# Patient Record
Sex: Female | Born: 1960 | Race: White | Hispanic: No | Marital: Married | Smoking: Never smoker
Health system: Southern US, Community
[De-identification: ages and names within clinical notes are randomized; demographics above are authoritative.]

## PROBLEM LIST (undated history)

## (undated) DIAGNOSIS — E039 Hypothyroidism, unspecified: Secondary | ICD-10-CM

## (undated) HISTORY — PX: EYE SURGERY: SHX253

## (undated) HISTORY — PX: ABDOMINAL HYSTERECTOMY: SHX81

---

## 2010-12-08 ENCOUNTER — Other Ambulatory Visit: Payer: Self-pay | Admitting: Obstetrics & Gynecology

## 2010-12-08 DIAGNOSIS — Z139 Encounter for screening, unspecified: Secondary | ICD-10-CM

## 2010-12-13 ENCOUNTER — Ambulatory Visit (HOSPITAL_COMMUNITY)
Admission: RE | Admit: 2010-12-13 | Discharge: 2010-12-13 | Disposition: A | Discharge status: Home / Self Care | Payer: 59 | Source: Ambulatory Visit | Attending: Obstetrics & Gynecology | Admitting: Obstetrics & Gynecology

## 2010-12-13 DIAGNOSIS — Z139 Encounter for screening, unspecified: Secondary | ICD-10-CM

## 2010-12-13 DIAGNOSIS — Z1231 Encounter for screening mammogram for malignant neoplasm of breast: Secondary | ICD-10-CM | POA: Insufficient documentation

## 2018-03-22 ENCOUNTER — Other Ambulatory Visit (HOSPITAL_COMMUNITY): Payer: Self-pay | Admitting: Family Medicine

## 2018-03-22 DIAGNOSIS — Z1231 Encounter for screening mammogram for malignant neoplasm of breast: Secondary | ICD-10-CM

## 2018-04-08 ENCOUNTER — Encounter (HOSPITAL_COMMUNITY): Payer: Self-pay

## 2018-04-08 ENCOUNTER — Ambulatory Visit (HOSPITAL_COMMUNITY)
Admission: RE | Admit: 2018-04-08 | Discharge: 2018-04-08 | Disposition: A | Discharge status: Home / Self Care | Payer: BLUE CROSS/BLUE SHIELD | Source: Ambulatory Visit | Attending: Family Medicine | Admitting: Family Medicine

## 2018-04-08 ENCOUNTER — Other Ambulatory Visit (HOSPITAL_COMMUNITY): Payer: Self-pay | Admitting: Family Medicine

## 2018-04-08 DIAGNOSIS — Z1231 Encounter for screening mammogram for malignant neoplasm of breast: Secondary | ICD-10-CM | POA: Diagnosis present

## 2018-04-11 ENCOUNTER — Other Ambulatory Visit (HOSPITAL_COMMUNITY): Payer: Self-pay | Admitting: Family Medicine

## 2018-04-11 DIAGNOSIS — R921 Mammographic calcification found on diagnostic imaging of breast: Secondary | ICD-10-CM

## 2018-05-14 ENCOUNTER — Ambulatory Visit (HOSPITAL_COMMUNITY)
Admission: RE | Admit: 2018-05-14 | Discharge: 2018-05-14 | Disposition: A | Discharge status: Home / Self Care | Payer: BLUE CROSS/BLUE SHIELD | Source: Ambulatory Visit | Attending: Family Medicine | Admitting: Family Medicine

## 2018-05-14 DIAGNOSIS — R921 Mammographic calcification found on diagnostic imaging of breast: Secondary | ICD-10-CM | POA: Insufficient documentation

## 2018-05-22 ENCOUNTER — Other Ambulatory Visit: Payer: Self-pay | Admitting: Family Medicine

## 2018-05-22 DIAGNOSIS — R921 Mammographic calcification found on diagnostic imaging of breast: Secondary | ICD-10-CM

## 2018-06-21 ENCOUNTER — Ambulatory Visit
Admission: RE | Admit: 2018-06-21 | Discharge: 2018-06-21 | Disposition: A | Discharge status: Home / Self Care | Payer: BLUE CROSS/BLUE SHIELD | Source: Ambulatory Visit | Attending: Family Medicine | Admitting: Family Medicine

## 2018-06-21 DIAGNOSIS — R921 Mammographic calcification found on diagnostic imaging of breast: Secondary | ICD-10-CM

## 2018-09-09 ENCOUNTER — Ambulatory Visit (INDEPENDENT_AMBULATORY_CARE_PROVIDER_SITE_OTHER): Payer: Self-pay

## 2018-09-09 DIAGNOSIS — Z1211 Encounter for screening for malignant neoplasm of colon: Secondary | ICD-10-CM

## 2018-09-09 MED ORDER — NA SULFATE-K SULFATE-MG SULF 17.5-3.13-1.6 GM/177ML PO SOLN: 1.0000 | ORAL | 0 refills | Status: AC

## 2018-09-09 NOTE — Progress Notes (Signed)
Gastroenterology Pre-Procedure Review  Request Date:09/09/18 Requesting Physician: Dr.DonDiego no previous tcs  PATIENT REVIEW QUESTIONS: The patient responded to the following health history questions as indicated:    1. Diabetes Melitis: no 2. Joint replacements in the past 12 months: no 3. Major health problems in the past 3 months: no 4. Has an artificial valve or MVP: no 5. Has a defibrillator: no 6. Has been advised in past to take antibiotics in advance of a procedure like teeth cleaning: no 7. Family history of colon cancer: no  8. Alcohol Use: no 9. History of sleep apnea: no  10. History of coronary artery or other vascular stents placed within the last 12 months: no 11. History of any prior anesthesia complications: no    MEDICATIONS & ALLERGIES:    Patient reports the following regarding taking any blood thinners:   Plavix? no Aspirin? no Coumadin? no Brilinta? no Xarelto? no Eliquis? no Pradaxa? no Savaysa? no Effient? no  Patient confirms/reports the following medications:  Current Outpatient Medications  Medication Sig Dispense Refill  . Cholecalciferol (VITAMIN D-1000 MAX ST) 25 MCG (1000 UT) tablet Take by mouth daily.    Marland Kitchen levothyroxine (SYNTHROID, LEVOTHROID) 100 MCG tablet Take 100 mcg by mouth daily.  5  . pravastatin (PRAVACHOL) 40 MG tablet Take 40 mg by mouth daily.  3   No current facility-administered medications for this visit.     Patient confirms/reports the following allergies:  No Known Allergies  No orders of the defined types were placed in this encounter.   AUTHORIZATION INFORMATION Primary Insurance: [place],  [place] #: [id number] Pre-Cert / Josem Kaufmann required:  Pre-Cert / Josem Kaufmann #:    SCHEDULE INFORMATION: Procedure has been scheduled as follows:  Date: 11/06/18, Time: 1:00 Location: [facility] Dr.Rourk  This Gastroenterology Pre-Precedure Review Form is being routed to the following provider(s):

## 2018-09-09 NOTE — Patient Instructions (Signed)
Barbara Cruz  Apr 22, 1961 MRN: [id number]     Procedure Date: 11/06/18 Time to register: 12:00pm Place to register: [facility] Procedure Time: 1:00pm Scheduled provider: R. Garfield Cornea, MD    PREPARATION FOR COLONOSCOPY WITH SUPREP BOWEL PREP KIT  Note: Suprep Bowel Prep Kit is a split-dose (2day) regimen. Consumption of BOTH 6-ounce bottles is required for a complete prep.  Please notify us immediately if you are diabetic, take iron supplements, or if you are on Coumadin or any other blood thinners.                                                                                                                                                  2 DAYS BEFORE PROCEDURE:  DATE:  11/04/18  DAY: Monday Begin clear liquid diet AFTER your lunch meal. NO SOLID FOODS after this point.  1 DAY BEFORE PROCEDURE:  DATE: 11/05/18   DAY: Tuesday Continue clear liquids the entire day - NO SOLID FOOD.   At 6:00pm: Complete steps 1 through 4 below, using ONE (1) 6-ounce bottle, before going to bed. Step 1:  Pour ONE (1) 6-ounce bottle of SUPREP liquid into the mixing container.  Step 2:  Add cool drinking water to the 16 ounce line on the container and mix.  Note: Dilute the solution concentrate as directed prior to use. Step 3:  DRINK ALL the liquid in the container. Step 4:  You MUST drink an additional two (2) or more 16 ounce containers of water over the next one (1) hour.   Continue clear liquids.  DAY OF PROCEDURE:   DATE: 11/06/18  DAY: Wednesday If you take medications for your heart, blood pressure, or breathing, you may take these medications.   5 hours before your procedure at :8:00am Step 1:  Pour ONE (1) 6-ounce bottle of SUPREP liquid into the mixing container.  Step 2:  Add cool drinking water to the 16 ounce line on the container and mix.  Note: Dilute the solution concentrate as directed prior to use. Step 3:  DRINK ALL the liquid in the container. Step 4:  You MUST  drink an additional two (2) or more 16 ounce containers of water over the next one (1) hour. You MUST complete the final glass of water at least 3 hours before your colonoscopy.   Nothing by mouth past 10:00am  You may take your morning medications with sip of water unless we have instructed otherwise.    Please see below for Dietary Information.  CLEAR LIQUIDS INCLUDE:  Water Jello (NOT red in color)   Ice Popsicles (NOT red in color)   Tea (sugar ok, no milk/cream) Powdered fruit flavored drinks  Coffee (sugar ok, no milk/cream) Gatorade/ Lemonade/ Kool-Aid  (NOT red in color)   Juice: apple, white grape, white cranberry Soft drinks  Clear bullion,  consomme, broth (fat free beef/chicken/vegetable)  Carbonated beverages (any kind)  Strained chicken noodle soup Hard Candy   Remember: Clear liquids are liquids that will allow you to see your fingers on the other side of a clear glass. Be sure liquids are NOT red in color, and not cloudy, but CLEAR.  DO NOT EAT OR DRINK ANY OF THE FOLLOWING:  Dairy products of any kind   Cranberry juice Tomato juice / V8 juice   Grapefruit juice Orange juice     Red grape juice  Do not eat any solid foods, including such foods as: cereal, oatmeal, yogurt, fruits, vegetables, creamed soups, eggs, bread, crackers, pureed foods in a blender, etc.   HELPFUL HINTS FOR DRINKING PREP SOLUTION:   Make sure prep is extremely cold. Mix and refrigerate the the morning of the prep. You may also put in the freezer.   You may try mixing some Crystal Light or Country Time Lemonade if you prefer. Mix in small amounts; add more if necessary.  Try drinking through a straw  Rinse mouth with water or a mouthwash between glasses, to remove after-taste.  Try sipping on a cold beverage /ice/ popsicles between glasses of prep.  Place a piece of sugar-free hard candy in mouth between glasses.  If you become nauseated, try consuming smaller amounts, or stretch out the  time between glasses. Stop for 30-60 minutes, then slowly start back drinking.     OTHER INSTRUCTIONS  You will need a responsible adult at least 59 years of age to accompany you and drive you home. This person must remain in the waiting room during your procedure. The hospital will cancel your procedure if you do not have a responsible adult with you.   1. Wear loose fitting clothing that is easily removed. 2. Leave jewelry and other valuables at home.  3. Remove all body piercing jewelry and leave at home. 4. Total time from sign-in until discharge is approximately 2-3 hours. 5. You should go home directly after your procedure and rest. You can resume normal activities the day after your procedure. 6. The day of your procedure you should not:  Drive  Make legal decisions  Operate machinery  Drink alcohol  Return to work   You may call the office (Dept: [phone]) before 5:00pm, or page the doctor on call [phone]) after 5:00pm, for further instructions, if necessary.   Insurance Information YOU WILL NEED TO CHECK WITH YOUR INSURANCE COMPANY FOR THE BENEFITS OF COVERAGE YOU HAVE FOR THIS PROCEDURE.  UNFORTUNATELY, NOT ALL INSURANCE COMPANIES HAVE BENEFITS TO COVER ALL OR PART OF THESE TYPES OF PROCEDURES.  IT IS YOUR RESPONSIBILITY TO CHECK YOUR BENEFITS, HOWEVER, WE WILL BE GLAD TO ASSIST YOU WITH ANY CODES YOUR INSURANCE COMPANY MAY NEED.    PLEASE NOTE THAT MOST INSURANCE COMPANIES WILL NOT COVER A SCREENING COLONOSCOPY FOR PEOPLE UNDER THE AGE OF 50  IF YOU HAVE BCBS INSURANCE, YOU MAY HAVE BENEFITS FOR A SCREENING COLONOSCOPY BUT IF POLYPS ARE FOUND THE DIAGNOSIS WILL CHANGE AND THEN YOU MAY HAVE A DEDUCTIBLE THAT WILL NEED TO BE MET. SO PLEASE MAKE SURE YOU CHECK YOUR BENEFITS FOR A SCREENING COLONOSCOPY AS WELL AS A DIAGNOSTIC COLONOSCOPY.

## 2018-09-27 NOTE — Progress Notes (Signed)
Ok to schedule.

## 2018-10-04 ENCOUNTER — Other Ambulatory Visit: Payer: Self-pay | Admitting: Family Medicine

## 2018-10-04 DIAGNOSIS — Z1231 Encounter for screening mammogram for malignant neoplasm of breast: Secondary | ICD-10-CM

## 2018-11-05 ENCOUNTER — Telehealth: Payer: Self-pay

## 2018-11-05 NOTE — Telephone Encounter (Signed)
Called pt, TCS for tomorrow moved up to 12:00pm. She will arrive at 11:00am. Advised her start drinking 2nd half of prep tomorrow at 7:00am. NPO after 9:00am. Endo scheduler informed.

## 2018-11-06 ENCOUNTER — Encounter (HOSPITAL_COMMUNITY): Payer: Self-pay | Admitting: *Deleted

## 2018-11-06 ENCOUNTER — Ambulatory Visit (HOSPITAL_COMMUNITY)
Admission: RE | Admit: 2018-11-06 | Discharge: 2018-11-06 | Disposition: A | Discharge status: Home / Self Care | Payer: BLUE CROSS/BLUE SHIELD | Attending: Internal Medicine | Admitting: Internal Medicine

## 2018-11-06 ENCOUNTER — Encounter (HOSPITAL_COMMUNITY)
Admission: RE | Disposition: A | Discharge status: Home / Self Care | Payer: Self-pay | Source: Home / Self Care | Attending: Internal Medicine

## 2018-11-06 ENCOUNTER — Other Ambulatory Visit: Payer: Self-pay

## 2018-11-06 DIAGNOSIS — Z79899 Other long term (current) drug therapy: Secondary | ICD-10-CM | POA: Insufficient documentation

## 2018-11-06 DIAGNOSIS — E039 Hypothyroidism, unspecified: Secondary | ICD-10-CM | POA: Insufficient documentation

## 2018-11-06 DIAGNOSIS — Z1211 Encounter for screening for malignant neoplasm of colon: Secondary | ICD-10-CM | POA: Insufficient documentation

## 2018-11-06 DIAGNOSIS — Z833 Family history of diabetes mellitus: Secondary | ICD-10-CM | POA: Insufficient documentation

## 2018-11-06 DIAGNOSIS — D125 Benign neoplasm of sigmoid colon: Secondary | ICD-10-CM | POA: Insufficient documentation

## 2018-11-06 DIAGNOSIS — Z8249 Family history of ischemic heart disease and other diseases of the circulatory system: Secondary | ICD-10-CM | POA: Diagnosis not present

## 2018-11-06 DIAGNOSIS — Z90710 Acquired absence of both cervix and uterus: Secondary | ICD-10-CM | POA: Diagnosis not present

## 2018-11-06 HISTORY — PX: POLYPECTOMY: SHX5525

## 2018-11-06 HISTORY — PX: COLONOSCOPY: SHX5424

## 2018-11-06 HISTORY — DX: Hypothyroidism, unspecified: E03.9

## 2018-11-06 SURGERY — COLONOSCOPY
Anesthesia: Moderate Sedation

## 2018-11-06 MED ORDER — ONDANSETRON HCL 4 MG/2ML IJ SOLN
INTRAMUSCULAR | Status: DC | PRN
  Administered 2018-11-06: 4 mg via INTRAVENOUS

## 2018-11-06 MED ORDER — STERILE WATER FOR IRRIGATION IR SOLN
Status: DC | PRN
  Administered 2018-11-06: 12:00:00

## 2018-11-06 MED ORDER — MEPERIDINE HCL 100 MG/ML IJ SOLN
INTRAMUSCULAR | Status: DC | PRN
  Administered 2018-11-06: 25 mg via INTRAVENOUS
  Administered 2018-11-06: 15 mg via INTRAVENOUS

## 2018-11-06 MED ORDER — MIDAZOLAM HCL 5 MG/5ML IJ SOLN
INTRAMUSCULAR | Status: DC | PRN
  Administered 2018-11-06 (×2): 1 mg via INTRAVENOUS
  Administered 2018-11-06 (×2): 2 mg via INTRAVENOUS
  Administered 2018-11-06: 1 mg via INTRAVENOUS

## 2018-11-06 MED ORDER — ONDANSETRON HCL 4 MG/2ML IJ SOLN
INTRAMUSCULAR | Status: AC
  Filled 2018-11-06: qty 2

## 2018-11-06 MED ORDER — MIDAZOLAM HCL 5 MG/5ML IJ SOLN
INTRAMUSCULAR | Status: AC
  Filled 2018-11-06: qty 10

## 2018-11-06 MED ORDER — MEPERIDINE HCL 100 MG/ML IJ SOLN
INTRAMUSCULAR | Status: AC
  Filled 2018-11-06: qty 1

## 2018-11-06 MED ORDER — SODIUM CHLORIDE 0.9 % IV SOLN
INTRAVENOUS | Status: DC
  Administered 2018-11-06: 1000 mL via INTRAVENOUS

## 2018-11-06 NOTE — Op Note (Signed)
[facility] Patient Name: Barbara Cruz Procedure Date: 11/06/2018 11:25 AM MRN: [id number] Date of Birth: [birth year] Attending MD: Norvel Richards , MD CSN: [id number] Age: 58 Admit Type: Outpatient Procedure:                Colonoscopy Indications:              Screening for colorectal malignant neoplasm Providers:                Norvel Richards, MD, Otis Peak B. Sharon Seller, RN,                            Nelma Rothman, Technician Referring MD:             Ralene Bathe. Dondiego, MD Medicines:                Midazolam 7 mg IV, Meperidine 40 mg IV Complications:            No immediate complications. Estimated Blood Loss:     Estimated blood loss was minimal. Procedure:                Pre-Anesthesia Assessment:                           - Prior to the procedure, a History and Physical                            was performed, and patient medications and                            allergies were reviewed. The patient's tolerance of                            previous anesthesia was also reviewed. The risks                            and benefits of the procedure and the sedation                            options and risks were discussed with the patient.                            All questions were answered, and informed consent                            was obtained. Prior Anticoagulants: The patient has                            taken no previous anticoagulant or antiplatelet                            agents. ASA Grade Assessment: II - A patient with                            mild systemic disease. After reviewing the risks  and benefits, the patient was deemed in                            satisfactory condition to undergo the procedure.                           After obtaining informed consent, the colonoscope                            was passed under direct vision. Throughout the                            procedure, the patient's blood  pressure, pulse, and                            oxygen saturations were monitored continuously. The                            CF-[id number] ([id number]) scope was introduced through                            the anus and advanced to the the cecum, identified                            by appendiceal orifice and ileocecal valve. The                            colonoscopy was performed without difficulty. The                            patient tolerated the procedure well. The quality                            of the bowel preparation was adequate. Scope In: 11:38:26 AM Scope Out: 11:55:58 AM Scope Withdrawal Time: 0 hours 13 minutes 10 seconds  Total Procedure Duration: 0 hours 17 minutes 32 seconds  Findings:      The perianal and digital rectal examinations were normal.      A 8 mm polyp was found in the sigmoid colon. The polyp was       semi-pedunculated. The polyp was removed with a cold snare. Resection       and retrieval were complete. Estimated blood loss was minimal.      The exam was otherwise without abnormality on direct and retroflexion       views. Impression:               - One 8 mm polyp in the sigmoid colon, removed with                            a cold snare. Resected and retrieved.                           - The examination was otherwise normal on direct  and retroflexion views. Moderate Sedation:      Moderate (conscious) sedation was administered by the endoscopy nurse       and supervised by the endoscopist. The following parameters were       monitored: oxygen saturation, heart rate, blood pressure, respiratory       rate, EKG, adequacy of pulmonary ventilation, and response to care.       Total physician intraservice time was 24 minutes. Recommendation:           - Patient has a contact number available for                            emergencies. The signs and symptoms of potential                            delayed complications were  discussed with the                            patient. Return to normal activities tomorrow.                            Written discharge instructions were provided to the                            patient.                           - Resume previous diet.                           - Repeat colonoscopy date to be determined after                            pending pathology results are reviewed for                            surveillance based on pathology results.                           - Return to GI office (date not yet determined). Procedure Code(s):        --- Professional ---                           [phone], Colonoscopy, flexible; with removal of                            tumor(s), polyp(s), or other lesion(s) by snare                            technique                           99153, Moderate sedation; each additional 15                            minutes intraservice time  G0500, Moderate sedation services provided by the                            same physician or other qualified health care                            professional performing a gastrointestinal                            endoscopic service that sedation supports,                            requiring the presence of an independent trained                            observer to assist in the monitoring of the                            patient's level of consciousness and physiological                            status; initial 15 minutes of intra-service time;                            patient age 23 years or older (additional time may                            be reported with [phone], as appropriate) Diagnosis Code(s):        --- Professional ---                           Z12.11, Encounter for screening for malignant                            neoplasm of colon                           D12.5, Benign neoplasm of sigmoid colon CPT copyright 2018 American Medical Association. All  rights reserved. The codes documented in this report are preliminary and upon coder review may  be revised to meet current compliance requirements. Cristopher Estimable. Aaryn Sermon, MD Norvel Richards, MD 11/06/2018 12:00:01 PM This report has been signed electronically. Number of Addenda: 0

## 2018-11-06 NOTE — H&P (Signed)
_0 @   Primary Care Physician:  Lucia Gaskins, MD Primary Gastroenterologist:  Dr. Gala Romney  Pre-Procedure History & Physical: HPI:  Barbara Cruz is a 58 y.o. female is here for a screening colonoscopy.  No bowel symptoms.  No family history of colon cancer.  No prior colonoscopy.  Past Medical History:  Diagnosis Date  . Hypothyroidism     Past Surgical History:  Procedure Laterality Date  . ABDOMINAL HYSTERECTOMY     partial  . EYE SURGERY Bilateral    lasik    Prior to Admission medications   Medication Sig Start Date End Date Taking? Authorizing Provider  Cholecalciferol (VITAMIN D3) 125 MCG (5000 UT) CAPS Take 5,000 Units by mouth daily.   Yes [provider]  levothyroxine (SYNTHROID, LEVOTHROID) 100 MCG tablet Take 100 mcg by mouth daily before breakfast.  06/05/18  Yes [provider]  Na Sulfate-K Sulfate-Mg Sulf (SUPREP BOWEL PREP KIT) 17.5-3.13-1.6 GM/177ML SOLN Take 1 kit by mouth as directed. 09/09/18  Yes Annitta Needs, NP  pravastatin (PRAVACHOL) 40 MG tablet Take 40 mg by mouth daily. 06/05/18  Yes [provider]    Allergies as of 09/09/2018  . (No Known Allergies)    Family History  Problem Relation Age of Onset  . Diabetes Mother   . Hypertension Mother   . Diabetes Father     Social History   Socioeconomic History  . Marital status: Married    Spouse name: Not on file  . Number of children: Not on file  . Years of education: Not on file  . Highest education level: Not on file  Occupational History  . Not on file  Social Needs  . Financial resource strain: Not on file  . Food insecurity:    Worry: Not on file    Inability: Not on file  . Transportation needs:    Medical: Not on file    Non-medical: Not on file  Tobacco Use  . Smoking status: Never Smoker  . Smokeless tobacco: Never Used  Substance and Sexual Activity  . Alcohol use: Yes    Comment: socally  . Drug use: Never  . Sexual activity: Not on file   Lifestyle  . Physical activity:    Days per week: Not on file    Minutes per session: Not on file  . Stress: Not on file  Relationships  . Social connections:    Talks on phone: Not on file    Gets together: Not on file    Attends religious service: Not on file    Active member of club or organization: Not on file    Attends meetings of clubs or organizations: Not on file    Relationship status: Not on file  . Intimate partner violence:    Fear of current or ex partner: Not on file    Emotionally abused: Not on file    Physically abused: Not on file    Forced sexual activity: Not on file  Other Topics Concern  . Not on file  Social History Narrative  . Not on file    Review of Systems: See HPI, otherwise negative ROS  Physical Exam: BP 127/69   Pulse 67   Temp 97.7 F (36.5 C) (Oral)   Resp 10   Ht _1  (1.778 m)   Wt 72.6 kg   SpO2 100%   BMI 22.96 kg/m  General:   Alert,  Well-developed, well-nourished, pleasant and cooperative in NAD Lungs:  Clear throughout  to auscultation.   No wheezes, crackles, or rhonchi. No acute distress. Heart:  Regular rate and rhythm; no murmurs, clicks, rubs,  or gallops. Abdomen:  Soft, nontender and nondistended. No masses, hepatosplenomegaly or hernias noted. Normal bowel sounds, without guarding, and without rebound.    Impression/Plan: Barbara Cruz is now here to undergo a screening colonoscopy.  First ever average risk screening examination.  Risks, benefits, limitations, imponderables and alternatives regarding colonoscopy have been reviewed with the patient. Questions have been answered. All parties agreeable.     Notice:  This dictation was prepared with Dragon dictation along with smaller phrase technology. Any transcriptional errors that result from this process are unintentional and may not be corrected upon review.

## 2018-11-06 NOTE — OR Nursing (Signed)
Barbara Cruz had a procedure done at [facility] today on 11/06/2018 and cannot return to work until 12:00 noon on 11/07/2018.  Ericka Pontiff, RN 11/06/18 12:41 PM

## 2018-11-06 NOTE — Discharge Instructions (Signed)
°Colonoscopy °Discharge Instructions ° °Read the instructions outlined below and refer to this sheet in the next few weeks. These discharge instructions provide you with general information on caring for yourself after you leave the hospital. Your doctor may also give you specific instructions. While your treatment has been planned according to the most current medical practices available, unavoidable complications occasionally occur. If you have any problems or questions after discharge, call Dr. Rourk at [phone]. °ACTIVITY °· You may resume your regular activity, but move at a slower pace for the next 24 hours.  °· Take frequent rest periods for the next 24 hours.  °· Walking will help get rid of the air and reduce the bloated feeling in your belly (abdomen).  °· No driving for 24 hours (because of the medicine (anesthesia) used during the test).   °· Do not sign any important legal documents or operate any machinery for 24 hours (because of the anesthesia used during the test).  °NUTRITION °· Drink plenty of fluids.  °· You may resume your normal diet as instructed by your doctor.  °· Begin with a light meal and progress to your normal diet. Heavy or fried foods are harder to digest and may make you feel sick to your stomach (nauseated).  °· Avoid alcoholic beverages for 24 hours or as instructed.  °MEDICATIONS °· You may resume your normal medications unless your doctor tells you otherwise.  °WHAT YOU CAN EXPECT TODAY °· Some feelings of bloating in the abdomen.  °· Passage of more gas than usual.  °· Spotting of blood in your stool or on the toilet paper.  °IF YOU HAD POLYPS REMOVED DURING THE COLONOSCOPY: °· No aspirin products for 7 days or as instructed.  °· No alcohol for 7 days or as instructed.  °· Eat a soft diet for the next 24 hours.  °FINDING OUT THE RESULTS OF YOUR TEST °Not all test results are available during your visit. If your test results are not back during the visit, make an appointment  with your caregiver to find out the results. Do not assume everything is normal if you have not heard from your caregiver or the medical facility. It is important for you to follow up on all of your test results.  °SEEK IMMEDIATE MEDICAL ATTENTION IF: °· You have more than a spotting of blood in your stool.  °· Your belly is swollen (abdominal distention).  °· You are nauseated or vomiting.  °· You have a temperature over 101.  °· You have abdominal pain or discomfort that is severe or gets worse throughout the day.  ° ° °Colon polyp information provided ° °Further recommendations to follow pending review of pathology report ° ° °Colon Polyps ° °Polyps are tissue growths inside the body. Polyps can grow in many places, including the large intestine (colon). A polyp may be a round bump or a mushroom-shaped growth. You could have one polyp or several. °Most colon polyps are noncancerous (benign). However, some colon polyps can become cancerous over time. Finding and removing the polyps early can help prevent this. °What are the causes? °The exact cause of colon polyps is not known. °What increases the risk? °You are more likely to develop this condition if you: °· Have a family history of colon cancer or colon polyps. °· Are older than 50 or older than 45 if you are African American. °· Have inflammatory bowel disease, such as ulcerative colitis or Crohn's disease. °· Have certain hereditary conditions, such   as: °? Familial adenomatous polyposis. °? Lynch syndrome. °? Turcot syndrome. °? Peutz-Jeghers syndrome. °· Are overweight. °· Smoke cigarettes. °· Do not get enough exercise. °· Drink too much alcohol. °· Eat a diet that is high in fat and red meat and low in fiber. °· Had childhood cancer that was treated with abdominal radiation. °What are the signs or symptoms? °Most polyps do not cause symptoms. °If you have symptoms, they may include: °· Blood coming from your rectum when having a bowel movement. °· Blood in  your stool. The stool may look dark red or black. °· Abdominal pain. °· A change in bowel habits, such as constipation or diarrhea. °How is this diagnosed? °This condition is diagnosed with a colonoscopy. This is a procedure in which a lighted, flexible scope is inserted into the anus and then passed into the colon to examine the area. Polyps are sometimes found when a colonoscopy is done as part of routine cancer screening tests. °How is this treated? °Treatment for this condition involves removing any polyps that are found. Most polyps can be removed during a colonoscopy. Those polyps will then be tested for cancer. Additional treatment may be needed depending on the results of testing. °Follow these instructions at home: °Lifestyle °· Maintain a healthy weight, or lose weight if recommended by your health care provider. °· Exercise every day or as told by your health care provider. °· Do not use any products that contain nicotine or tobacco, such as cigarettes and e-cigarettes. If you need help quitting, ask your health care provider. °· If you drink alcohol, limit how much you have: °? 0-1 drink a day for women. °? 0-2 drinks a day for men. °· Be aware of how much alcohol is in your drink. In the [place] one drink equals one 12 oz bottle of beer (355 mL), one 5 oz glass of wine (148 mL), or one 1½ oz shot of hard liquor (44 mL). °Eating and drinking ° °· Eat foods that are high in fiber, such as fruits, vegetables, and whole grains. °· Eat foods that are high in calcium and vitamin D, such as milk, cheese, yogurt, eggs, liver, fish, and broccoli. °· Limit foods that are high in fat, such as fried foods and desserts. °· Limit the amount of red meat and processed meat you eat, such as hot dogs, sausage, bacon, and lunch meats. °General instructions °· Keep all follow-up visits as told by your health care provider. This is important. °? This includes having regularly scheduled colonoscopies. °? Talk to your health  care provider about when you need a colonoscopy. °Contact a health care provider if: °· You have new or worsening bleeding during a bowel movement. °· You have new or increased blood in your stool. °· You have a change in bowel habits. °· You lose weight for no known reason. °Summary °· Polyps are tissue growths inside the body. Polyps can grow in many places, including the colon. °· Most colon polyps are noncancerous (benign), but some can become cancerous over time. °· This condition is diagnosed with a colonoscopy. °· Treatment for this condition involves removing any polyps that are found. Most polyps can be removed during a colonoscopy. °This information is not intended to replace advice given to you by your health care provider. Make sure you discuss any questions you have with your health care provider. °Document Released: 05/31/2004 Document Revised: 12/20/2017 Document Reviewed: 12/20/2017 °[employer] Interactive Patient Education © 2019 [employer] ° °

## 2018-11-08 ENCOUNTER — Encounter (HOSPITAL_COMMUNITY): Payer: Self-pay | Admitting: Internal Medicine

## 2018-11-09 ENCOUNTER — Encounter: Payer: Self-pay | Admitting: Internal Medicine

## 2019-04-11 ENCOUNTER — Ambulatory Visit: Payer: BLUE CROSS/BLUE SHIELD

## 2019-07-08 IMAGING — MG STEREOTACTIC VACUUM ASSIST RIGHT
7 series · 7 of 7 positions shown · non-contrast
Comparison: Previous exams.

ADDENDUM:
Pathology revealed FIBROADENOMA, MICROCALCIFICATIONS of the Left
breast, upper outer quadrant. This was found to be concordant by Dr.
Niezz Laminah.

Pathology revealed FIBROADENOMA, MICROCALCIFICATIONS of the Right
breast, upper inner quadrant. This was found to be concordant by Dr.
Pathology results were discussed with the patient by telephone. The
patient reported doing well after the biopsies with tenderness at
the sites. Post biopsy instructions and care were reviewed and
questions were answered. The patient was encouraged to call The
The patient was instructed to return for annual screening
mammography at [HOSPITAL] in [HOSPITAL][HOSPITAL].
Pathology results reported by Barnellini Waclawek, RN on 06/24/2018.
CLINICAL DATA: Patient presents for stereotactic core needle biopsy
of bilateral, amorphous calcifications.
EXAM:
LEFT BREAST STEREOTACTIC CORE NEEDLE BIOPSY
RIGHT BREAST STEREOTACTIC CORE NEEDLE BIOPSY

[R (1 of 7)]
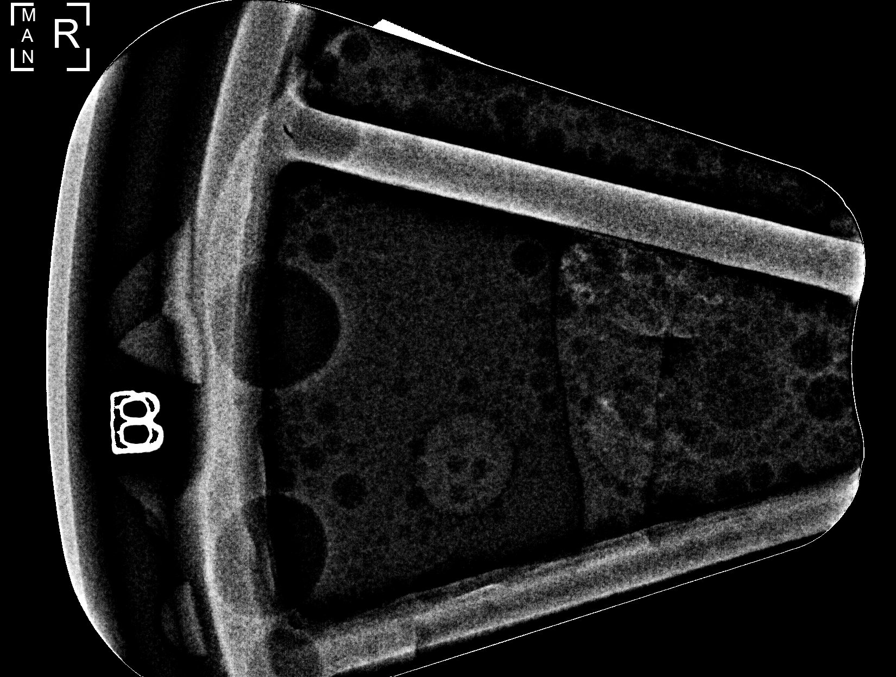

[R (2 of 7)]
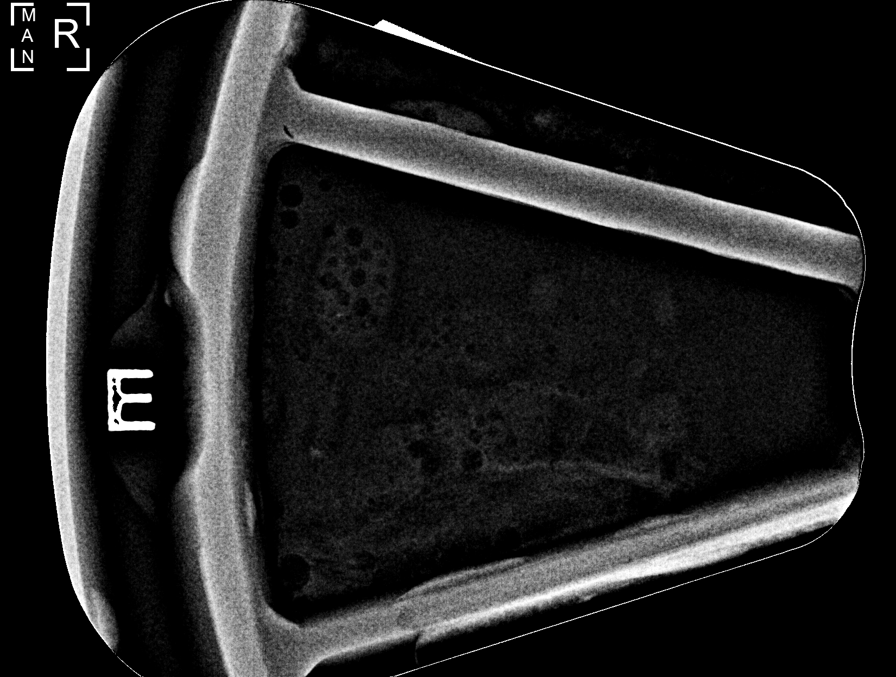

[R (3 of 7)]
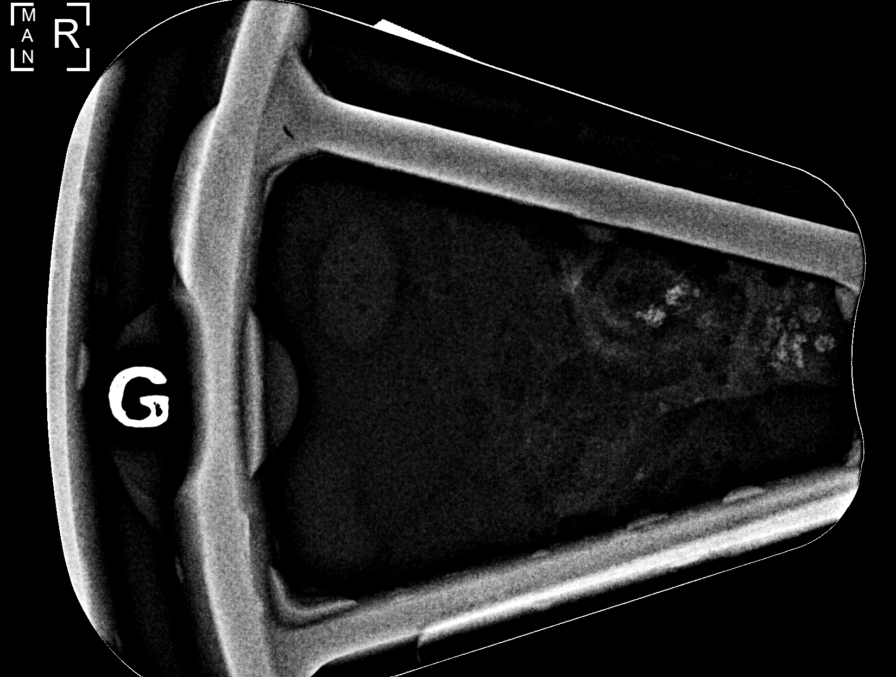

[R (4 of 7)]
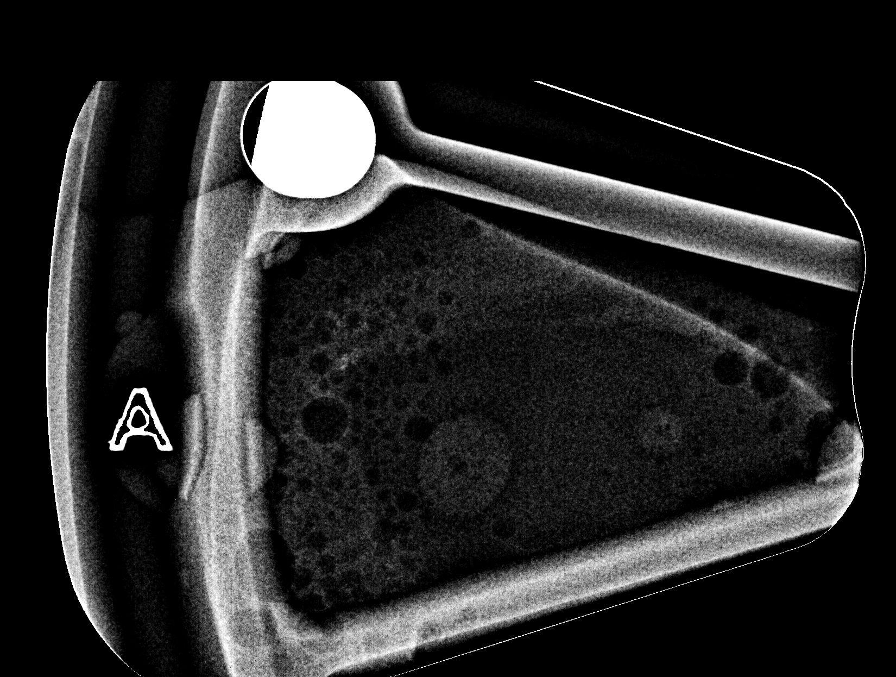

[R (5 of 7)]
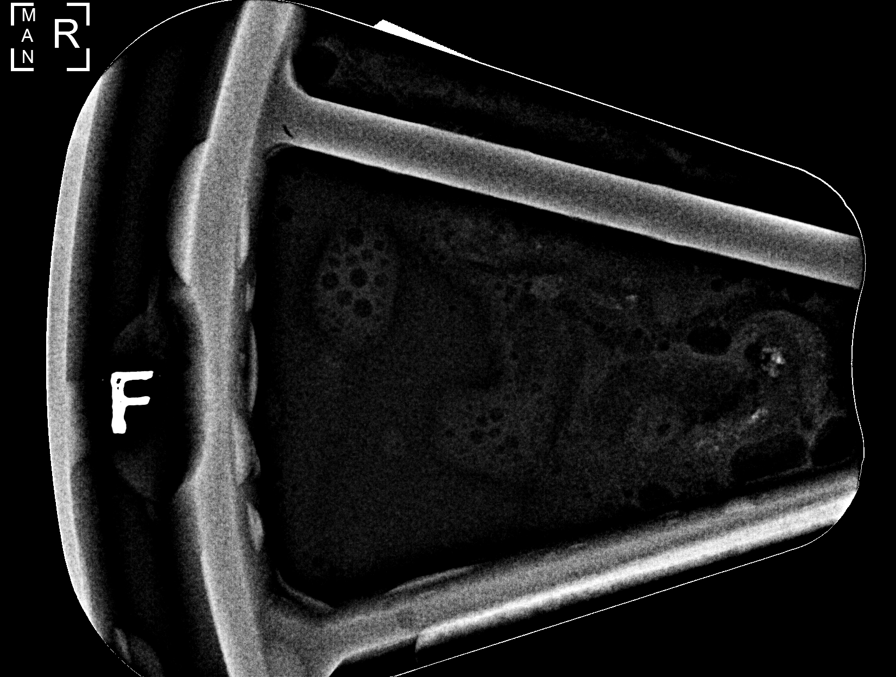

[R (6 of 7)]
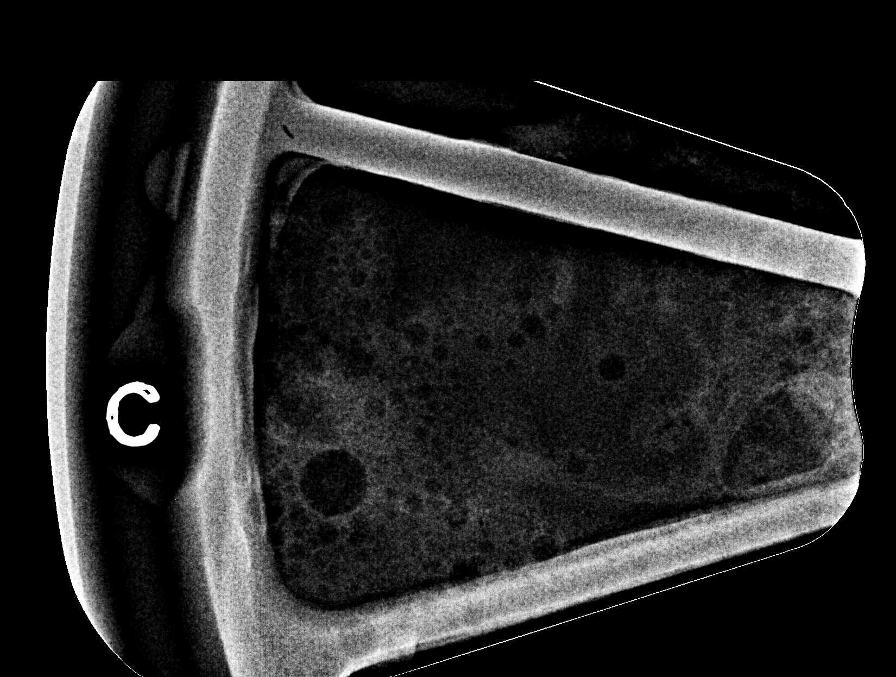

[R (7 of 7)]
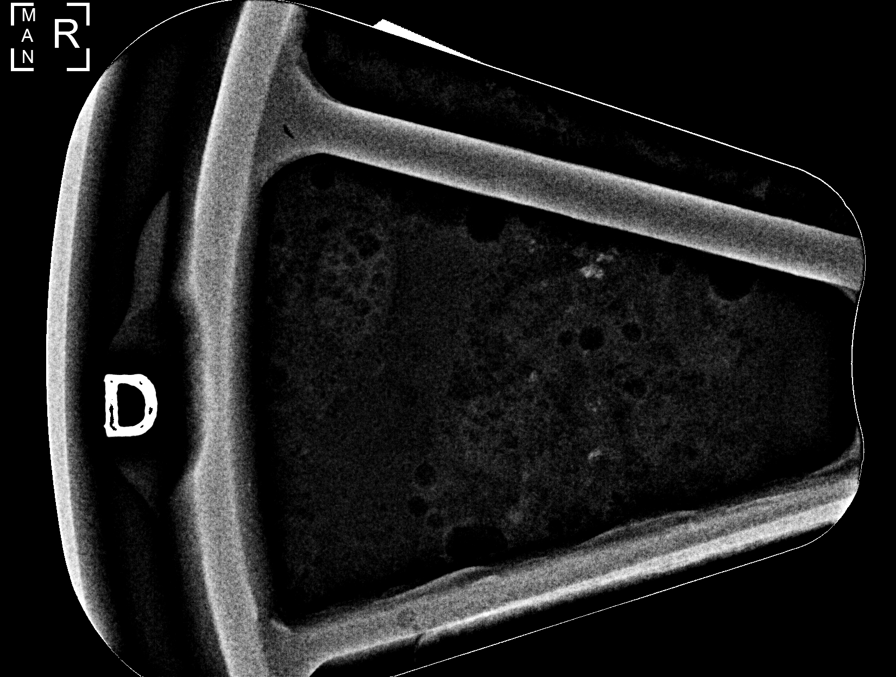

[7 of 7 positions shown; findings below may reference images not displayed]



Left Breast, Lesion #1: lesion quadrant: Upper outer quadrant

Using sterile technique and 1% Lidocaine as local anesthetic, under
stereotactic guidance, a 9 gauge vacuum assisted device was used to
perform core needle biopsy of calcifications in the quadrant left
breast using a superior approach. Specimen radiograph was performed
showing multiple calcifications for which was. Specimens with
calcifications are identified for pathology.

At the conclusion of the procedure, a coil shaped tissue marker clip
was deployed into the biopsy cavity. Follow-up 2-view mammogram was
performed and dictated separately.

Right Breast, Lesion #2: lesion quadrant: Upper inner quadrant

Using sterile technique and 1% Lidocaine as local anesthetic, under
stereotactic guidance, a 9 gauge vacuum assisted device was used to
perform core needle biopsy of calcifications inner quadrant right
breast using a superior approach. Specimen radiograph was performed
showing multiple calcifications for which. Specimens with
calcifications are identified for pathology.

At the conclusion of the procedure, a coil shaped tissue marker clip
was deployed into the biopsy cavity. Follow-up 2-view mammogram was
performed and dictated separately.
IMPRESSION: Stereotactic-guided biopsy of amorphous calcifications in each
breast. No apparent complications.

## 2019-07-08 IMAGING — MG MM CLIP PLACEMENT
2 series · 2 of 2 positions shown · non-contrast
Comparison: Previous exam(s).

CLINICAL DATA: Patient presents for stereotactic core needle biopsy
calcifications in each breast.

EXAM:
DIAGNOSTIC LEFT MAMMOGRAM POST STEREOTACTIC BIOPSY
DIAGNOSTIC RIGHT MAMMOGRAM POST STEREOTACTIC BIOPSY

[R CC]
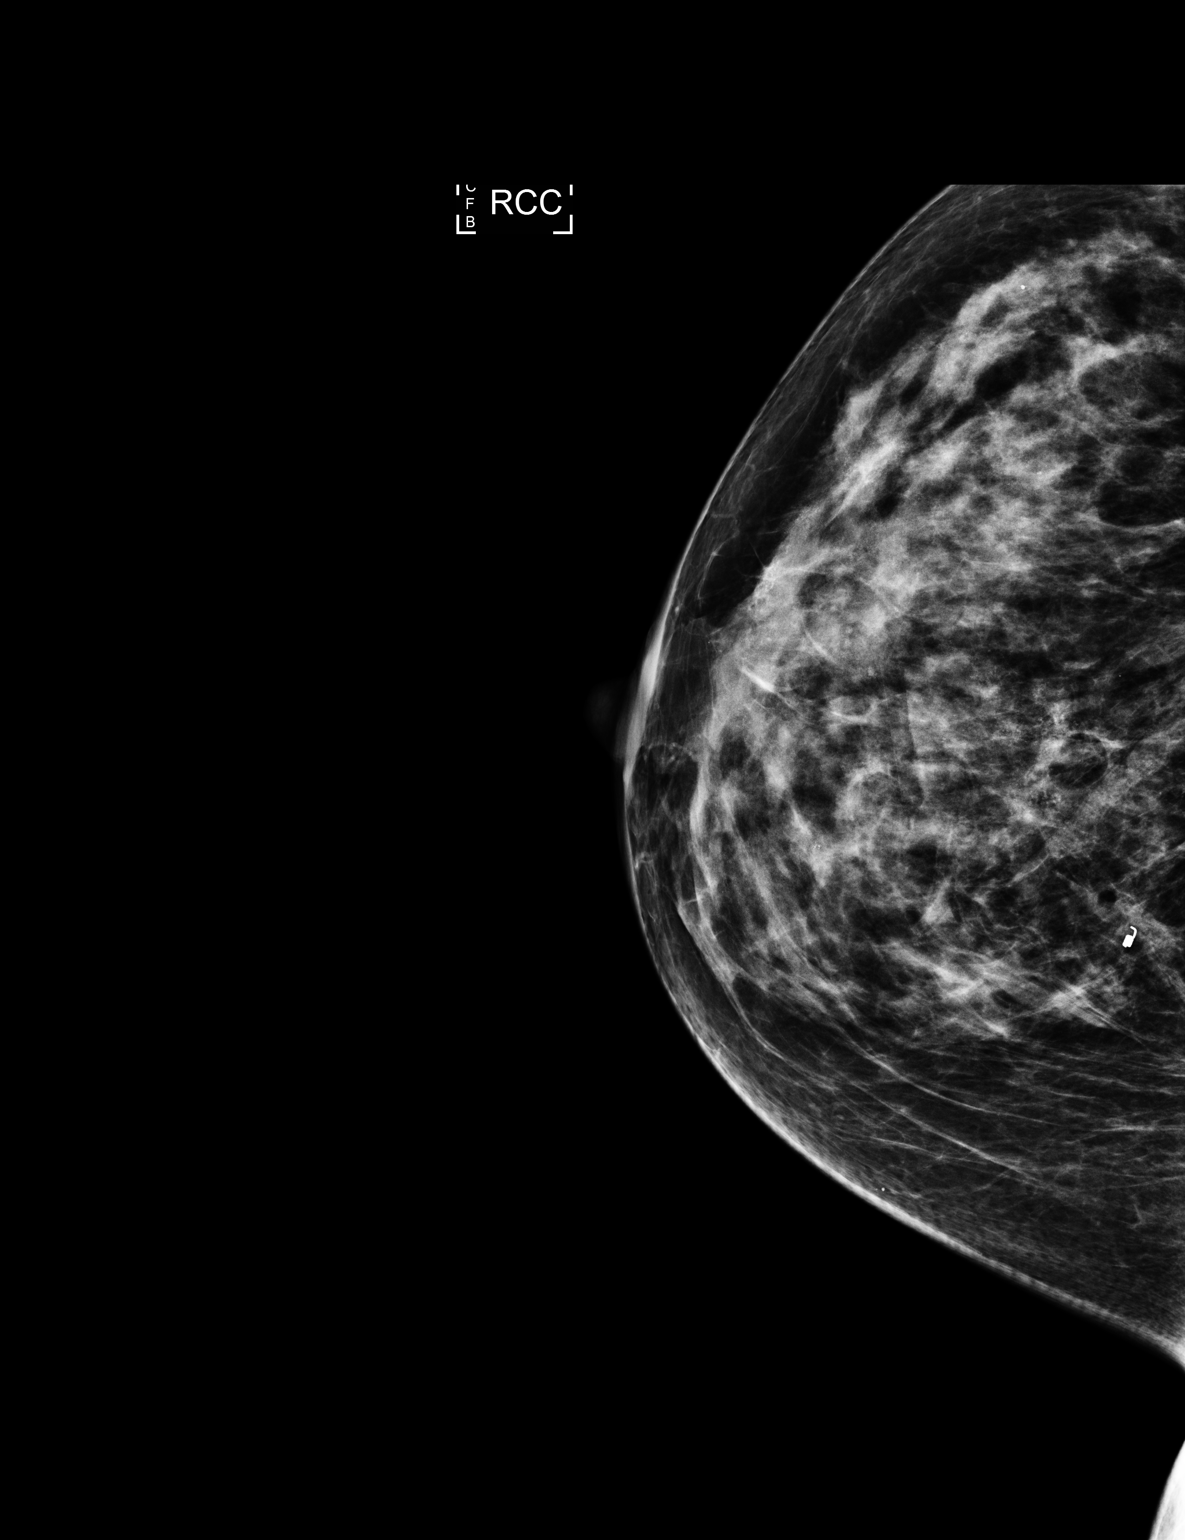

[R ML]
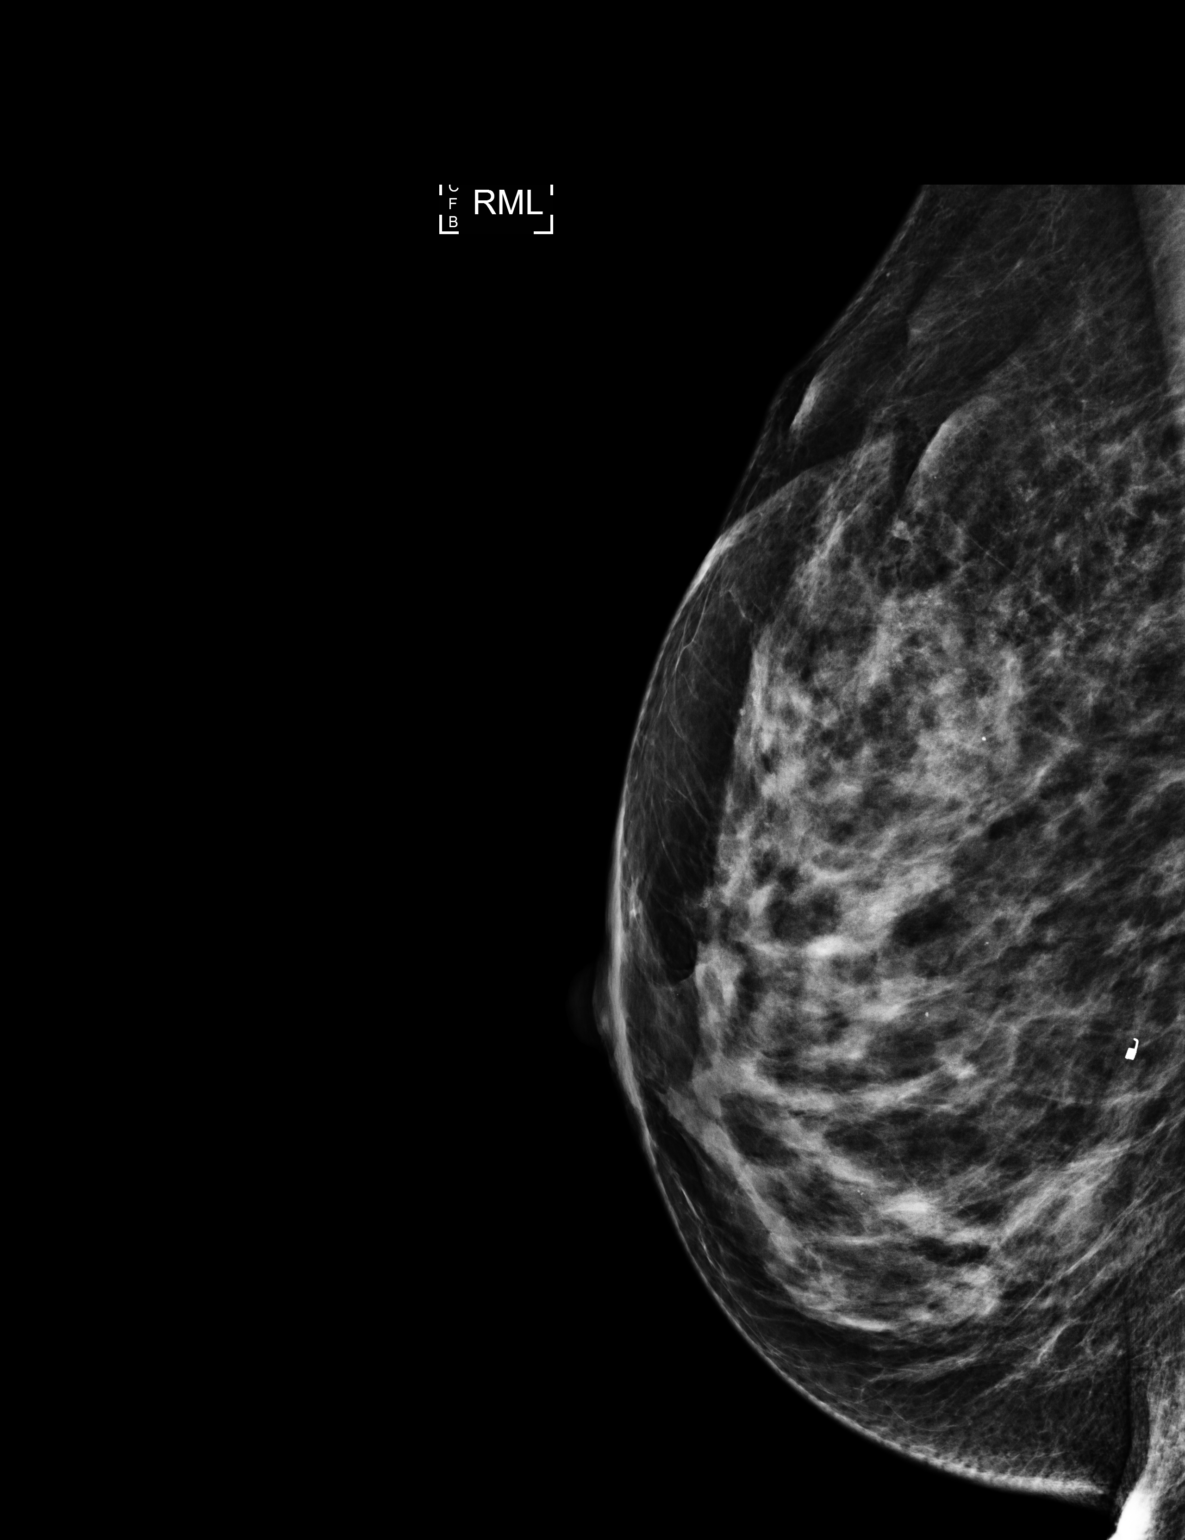

[2 of 2 positions shown; findings below may reference images not displayed]

FINDINGS: Mammographic images were obtained following stereotactic guided
biopsy of left breast calcifications. The coil shaped biopsy clip
lies the quadrant adjacent to residual amorphous calcifications.

Mammographic images were obtained following stereotactic guided
biopsy of right breast calcifications. The coil shaped biopsy clip
lies in the posterior, medial breast, slightly inferior to midline,
in the expected location the amorphous calcifications.
IMPRESSION: 1. Well-positioned coil shaped biopsy in the left breast.
2. Well-positioned coil shaped clip in the right breast. Clip
projects medially, slightly towards lower inner quadrant.
Calcifications were described as in the upper inner quadrant on the
biopsy report and pathology request. The calcifications appear to
span medially from the upper inner to the slightly lower inner
quadrants.

Final Assessment: Post Procedure Mammograms for Marker Placement

## 2023-09-27 ENCOUNTER — Encounter: Payer: Self-pay | Admitting: *Deleted
